# Patient Record
Sex: Male | Born: 1960 | Race: White | Hispanic: No | Marital: Single | Smoking: Current every day smoker
Health system: Southern US, Community
[De-identification: ages and names within clinical notes are randomized; demographics above are authoritative.]

## PROBLEM LIST (undated history)

## (undated) DIAGNOSIS — N200 Calculus of kidney: Secondary | ICD-10-CM

---

## 2012-06-17 ENCOUNTER — Emergency Department: Payer: Self-pay | Admitting: Emergency Medicine

## 2012-06-21 ENCOUNTER — Emergency Department: Payer: Self-pay | Admitting: Emergency Medicine

## 2013-04-14 ENCOUNTER — Emergency Department: Payer: Self-pay | Admitting: Emergency Medicine

## 2013-04-14 LAB — URINALYSIS, COMPLETE
Bacteria: NONE SEEN
Bilirubin,UR: NEGATIVE
GLUCOSE, UR: NEGATIVE mg/dL (ref 0–75)
Ketone: NEGATIVE
Leukocyte Esterase: NEGATIVE
Nitrite: NEGATIVE
Ph: 6 (ref 4.5–8.0)
Protein: NEGATIVE
SPECIFIC GRAVITY: 1.002 (ref 1.003–1.030)
Squamous Epithelial: NONE SEEN
WBC UR: 1 /HPF (ref 0–5)

## 2013-04-14 LAB — COMPREHENSIVE METABOLIC PANEL
ALK PHOS: 67 U/L
AST: 37 U/L (ref 15–37)
Albumin: 4.7 g/dL (ref 3.4–5.0)
Anion Gap: 2 — ABNORMAL LOW (ref 7–16)
BUN: 10 mg/dL (ref 7–18)
Bilirubin,Total: 0.6 mg/dL (ref 0.2–1.0)
Calcium, Total: 8.7 mg/dL (ref 8.5–10.1)
Chloride: 105 mmol/L (ref 98–107)
Co2: 32 mmol/L (ref 21–32)
Creatinine: 0.72 mg/dL (ref 0.60–1.30)
EGFR (African American): >60
GLUCOSE: 98 mg/dL (ref 65–99)
Osmolality: 277 (ref 275–301)
POTASSIUM: 3.7 mmol/L (ref 3.5–5.1)
SGPT (ALT): 34 U/L (ref 12–78)
Sodium: 139 mmol/L (ref 136–145)
Total Protein: 7.8 g/dL (ref 6.4–8.2)

## 2013-04-14 LAB — CBC WITH DIFFERENTIAL/PLATELET
BASOS PCT: 0.5 %
Basophil #: 0.1 10*3/uL (ref 0.0–0.1)
Eosinophil #: 0.3 10*3/uL (ref 0.0–0.7)
Eosinophil %: 2.2 %
HCT: 42.3 % (ref 40.0–52.0)
HGB: 14.4 g/dL (ref 13.0–18.0)
LYMPHS ABS: 2.4 10*3/uL (ref 1.0–3.6)
Lymphocyte %: 17.3 %
MCH: 31.5 pg (ref 26.0–34.0)
MCHC: 34.1 g/dL (ref 32.0–36.0)
MCV: 92 fL (ref 80–100)
Monocyte #: 1.2 x10 3/mm — ABNORMAL HIGH (ref 0.2–1.0)
Monocyte %: 8.4 %
NEUTROS ABS: 10.1 10*3/uL — AB (ref 1.4–6.5)
NEUTROS PCT: 71.6 %
Platelet: 304 10*3/uL (ref 150–440)
RBC: 4.58 10*6/uL (ref 4.40–5.90)
RDW: 13.1 % (ref 11.5–14.5)
WBC: 14.1 10*3/uL — ABNORMAL HIGH (ref 3.8–10.6)

## 2013-04-14 LAB — LIPASE, BLOOD: Lipase: 76 U/L (ref 73–393)

## 2013-12-26 ENCOUNTER — Emergency Department: Payer: Self-pay | Admitting: Emergency Medicine

## 2013-12-26 LAB — BASIC METABOLIC PANEL
Anion Gap: 3 — ABNORMAL LOW (ref 7–16)
BUN: 9 mg/dL (ref 7–18)
CO2: 29 mmol/L (ref 21–32)
Calcium, Total: 9.1 mg/dL (ref 8.5–10.1)
Chloride: 105 mmol/L (ref 98–107)
Creatinine: 0.82 mg/dL (ref 0.60–1.30)
EGFR (Non-African Amer.): >60
Glucose: 100 mg/dL — ABNORMAL HIGH (ref 65–99)
Osmolality: 273 (ref 275–301)
Potassium: 4.1 mmol/L (ref 3.5–5.1)
Sodium: 137 mmol/L (ref 136–145)

## 2013-12-26 LAB — URINALYSIS, COMPLETE
Bilirubin,UR: NEGATIVE
Glucose,UR: NEGATIVE mg/dL (ref 0–75)
KETONE: NEGATIVE
LEUKOCYTE ESTERASE: NEGATIVE
Nitrite: NEGATIVE
Ph: 6 (ref 4.5–8.0)
SQUAMOUS EPITHELIAL: NONE SEEN
Specific Gravity: 1.017 (ref 1.003–1.030)
WBC UR: 47 /HPF (ref 0–5)

## 2013-12-26 LAB — CBC
HCT: 44.1 % (ref 40.0–52.0)
HGB: 14.8 g/dL (ref 13.0–18.0)
MCH: 30.9 pg (ref 26.0–34.0)
MCHC: 33.5 g/dL (ref 32.0–36.0)
MCV: 92 fL (ref 80–100)
Platelet: 330 10*3/uL (ref 150–440)
RBC: 4.78 10*6/uL (ref 4.40–5.90)
RDW: 13.4 % (ref 11.5–14.5)
WBC: 14.2 10*3/uL — AB (ref 3.8–10.6)

## 2016-06-17 ENCOUNTER — Encounter: Payer: Self-pay | Admitting: *Deleted

## 2016-06-17 ENCOUNTER — Emergency Department
Admission: EM | Admit: 2016-06-17 | Discharge: 2016-06-17 | Disposition: A | Discharge status: Home / Self Care | Payer: Self-pay | Attending: Emergency Medicine | Admitting: Emergency Medicine

## 2016-06-17 DIAGNOSIS — M5441 Lumbago with sciatica, right side: Secondary | ICD-10-CM | POA: Insufficient documentation

## 2016-06-17 DIAGNOSIS — Y9389 Activity, other specified: Secondary | ICD-10-CM | POA: Insufficient documentation

## 2016-06-17 DIAGNOSIS — Y92007 Garden or yard of unspecified non-institutional (private) residence as the place of occurrence of the external cause: Secondary | ICD-10-CM | POA: Insufficient documentation

## 2016-06-17 DIAGNOSIS — S39012A Strain of muscle, fascia and tendon of lower back, initial encounter: Secondary | ICD-10-CM | POA: Insufficient documentation

## 2016-06-17 DIAGNOSIS — X500XXA Overexertion from strenuous movement or load, initial encounter: Secondary | ICD-10-CM | POA: Insufficient documentation

## 2016-06-17 DIAGNOSIS — F17210 Nicotine dependence, cigarettes, uncomplicated: Secondary | ICD-10-CM | POA: Insufficient documentation

## 2016-06-17 DIAGNOSIS — Y999 Unspecified external cause status: Secondary | ICD-10-CM | POA: Insufficient documentation

## 2016-06-17 DIAGNOSIS — M5431 Sciatica, right side: Secondary | ICD-10-CM

## 2016-06-17 HISTORY — DX: Calculus of kidney: N20.0

## 2016-06-17 MED ORDER — PREDNISONE 10 MG (21) PO TBPK: ORAL_TABLET | ORAL | 0 refills | Status: AC

## 2016-06-17 MED ORDER — CYCLOBENZAPRINE HCL 5 MG PO TABS: 5.0000 mg | ORAL_TABLET | Freq: Three times a day (TID) | ORAL | 0 refills | Status: AC | PRN

## 2016-06-17 MED ORDER — KETOROLAC TROMETHAMINE 60 MG/2ML IM SOLN
30.0000 mg | Freq: Once | INTRAMUSCULAR | Status: AC
  Administered 2016-06-17: 30 mg via INTRAMUSCULAR

## 2016-06-17 MED ORDER — KETOROLAC TROMETHAMINE 30 MG/ML IJ SOLN
30.0000 mg | Freq: Once | INTRAMUSCULAR | Status: DC
  Filled 2016-06-17: qty 1
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
## 2016-06-17 NOTE — ED Triage Notes (Signed)
PT reports feeling "something slip" in his back while doing yard work this week. Pt reports having chronic  Left leg pain but reports left sided back pain and left leg pain have increased since this weekend. Pt able to ambulate but reports a squeezing pain when putting weight on left leg.

## 2016-06-17 NOTE — ED Notes (Signed)
See triage note  States he developed pain to left lower back which moving into left leg last week  Unable to bear wt ambulates with limp

## 2019-12-07 ENCOUNTER — Emergency Department: Payer: Self-pay

## 2019-12-07 ENCOUNTER — Emergency Department
Admission: EM | Admit: 2019-12-07 | Discharge: 2019-12-07 | Disposition: A | Discharge status: Home / Self Care | Payer: Self-pay | Attending: Emergency Medicine | Admitting: Emergency Medicine

## 2019-12-07 ENCOUNTER — Encounter: Payer: Self-pay | Admitting: Emergency Medicine

## 2019-12-07 ENCOUNTER — Other Ambulatory Visit: Payer: Self-pay

## 2019-12-07 DIAGNOSIS — R7401 Elevation of levels of liver transaminase levels: Secondary | ICD-10-CM

## 2019-12-07 DIAGNOSIS — F17210 Nicotine dependence, cigarettes, uncomplicated: Secondary | ICD-10-CM | POA: Insufficient documentation

## 2019-12-07 DIAGNOSIS — N200 Calculus of kidney: Secondary | ICD-10-CM

## 2019-12-07 DIAGNOSIS — E860 Dehydration: Secondary | ICD-10-CM | POA: Insufficient documentation

## 2019-12-07 DIAGNOSIS — K838 Other specified diseases of biliary tract: Secondary | ICD-10-CM

## 2019-12-07 DIAGNOSIS — E876 Hypokalemia: Secondary | ICD-10-CM | POA: Insufficient documentation

## 2019-12-07 DIAGNOSIS — J189 Pneumonia, unspecified organism: Secondary | ICD-10-CM | POA: Insufficient documentation

## 2019-12-07 DIAGNOSIS — R509 Fever, unspecified: Secondary | ICD-10-CM

## 2019-12-07 DIAGNOSIS — Z20822 Contact with and (suspected) exposure to covid-19: Secondary | ICD-10-CM | POA: Insufficient documentation

## 2019-12-07 DIAGNOSIS — E8809 Other disorders of plasma-protein metabolism, not elsewhere classified: Secondary | ICD-10-CM | POA: Insufficient documentation

## 2019-12-07 LAB — CBC WITH DIFFERENTIAL/PLATELET
Abs Immature Granulocytes: 0.22 10*3/uL — ABNORMAL HIGH (ref 0.00–0.07)
Basophils Absolute: 0.1 10*3/uL (ref 0.0–0.1)
Basophils Relative: 0 %
Eosinophils Absolute: 0.1 10*3/uL (ref 0.0–0.5)
Eosinophils Relative: 1 %
HCT: 41.4 % (ref 39.0–52.0)
Hemoglobin: 14.8 g/dL (ref 13.0–17.0)
Immature Granulocytes: 2 %
Lymphocytes Relative: 9 %
Lymphs Abs: 1.4 10*3/uL (ref 0.7–4.0)
MCH: 30.6 pg (ref 26.0–34.0)
MCHC: 35.7 g/dL (ref 30.0–36.0)
MCV: 85.7 fL (ref 80.0–100.0)
Monocytes Absolute: 1.1 10*3/uL — ABNORMAL HIGH (ref 0.1–1.0)
Monocytes Relative: 7 %
Neutro Abs: 11.7 10*3/uL — ABNORMAL HIGH (ref 1.7–7.7)
Neutrophils Relative %: 81 %
Platelets: 285 10*3/uL (ref 150–400)
RBC: 4.83 MIL/uL (ref 4.22–5.81)
RDW: 14.1 % (ref 11.5–15.5)
WBC: 14.5 10*3/uL — ABNORMAL HIGH (ref 4.0–10.5)
nRBC: 0 % (ref 0.0–0.2)

## 2019-12-07 LAB — URINALYSIS, COMPLETE (UACMP) WITH MICROSCOPIC
Bacteria, UA: NONE SEEN
Bilirubin Urine: NEGATIVE
Glucose, UA: 50 mg/dL — AB
Ketones, ur: NEGATIVE mg/dL
Leukocytes,Ua: NEGATIVE
Nitrite: NEGATIVE
Protein, ur: NEGATIVE mg/dL
Specific Gravity, Urine: 1.021 (ref 1.005–1.030)
pH: 6 (ref 5.0–8.0)

## 2019-12-07 LAB — CBC
HCT: 41.4 % (ref 39.0–52.0)
Hemoglobin: 14.7 g/dL (ref 13.0–17.0)
MCH: 30.4 pg (ref 26.0–34.0)
MCHC: 35.5 g/dL (ref 30.0–36.0)
MCV: 85.5 fL (ref 80.0–100.0)
Platelets: 282 10*3/uL (ref 150–400)
RBC: 4.84 MIL/uL (ref 4.22–5.81)
RDW: 13.9 % (ref 11.5–15.5)
WBC: 13.8 10*3/uL — ABNORMAL HIGH (ref 4.0–10.5)
nRBC: 0 % (ref 0.0–0.2)

## 2019-12-07 LAB — COMPREHENSIVE METABOLIC PANEL
ALT: 46 U/L — ABNORMAL HIGH (ref 0–44)
AST: 74 U/L — ABNORMAL HIGH (ref 15–41)
Albumin: 2.6 g/dL — ABNORMAL LOW (ref 3.5–5.0)
Alkaline Phosphatase: 131 U/L — ABNORMAL HIGH (ref 38–126)
Anion gap: 10 (ref 5–15)
BUN: 13 mg/dL (ref 8–23)
CO2: 28 mmol/L (ref 22–32)
Calcium: 7.9 mg/dL — ABNORMAL LOW (ref 8.9–10.3)
Chloride: 95 mmol/L — ABNORMAL LOW (ref 98–111)
Creatinine, Ser: 0.75 mg/dL (ref 0.61–1.24)
GFR, Estimated: >60 mL/min (ref >60)
Glucose, Bld: 106 mg/dL — ABNORMAL HIGH (ref 70–99)
Potassium: 3 mmol/L — ABNORMAL LOW (ref 3.5–5.1)
Sodium: 133 mmol/L — ABNORMAL LOW (ref 135–145)
Total Bilirubin: 0.7 mg/dL (ref 0.3–1.2)
Total Protein: 6.2 g/dL — ABNORMAL LOW (ref 6.5–8.1)

## 2019-12-07 LAB — PROCALCITONIN: Procalcitonin: 0.83 ng/mL

## 2019-12-07 LAB — RESPIRATORY PANEL BY RT PCR (FLU A&B, COVID)
Influenza A by PCR: NEGATIVE
Influenza B by PCR: NEGATIVE
SARS Coronavirus 2 by RT PCR: NEGATIVE

## 2019-12-07 LAB — LIPASE, BLOOD: Lipase: 15 U/L (ref 11–51)

## 2019-12-07 MED ORDER — SODIUM CHLORIDE 0.9 % IV SOLN
1.0000 g | Freq: Once | INTRAVENOUS | Status: AC
  Administered 2019-12-07: 1 g via INTRAVENOUS
  Filled 2019-12-07: qty 10

## 2019-12-07 MED ORDER — LACTATED RINGERS IV BOLUS
1000.0000 mL | Freq: Once | INTRAVENOUS | Status: AC
  Administered 2019-12-07: 1000 mL via INTRAVENOUS

## 2019-12-07 MED ORDER — DOXYCYCLINE HYCLATE 100 MG PO CAPS
100.0000 mg | ORAL_CAPSULE | Freq: Two times a day (BID) | ORAL | 0 refills | Status: DC
Start: 2019-12-07 — End: 2019-12-07

## 2019-12-07 MED ORDER — POTASSIUM CHLORIDE CRYS ER 20 MEQ PO TBCR
40.0000 meq | EXTENDED_RELEASE_TABLET | Freq: Once | ORAL | Status: AC
  Administered 2019-12-07: 40 meq via ORAL
  Filled 2019-12-07: qty 2

## 2019-12-07 MED ORDER — IOHEXOL 300 MG/ML  SOLN
100.0000 mL | Freq: Once | INTRAMUSCULAR | Status: AC | PRN
  Administered 2019-12-07: 100 mL via INTRAVENOUS
  Filled 2019-12-07: qty 100

## 2019-12-07 MED ORDER — DOXYCYCLINE HYCLATE 100 MG PO CAPS
100.0000 mg | ORAL_CAPSULE | Freq: Two times a day (BID) | ORAL | 0 refills | Status: AC
Start: 2019-12-07 — End: 2019-12-17

## 2019-12-07 MED ORDER — SODIUM CHLORIDE 0.9 % IV SOLN
500.0000 mg | Freq: Once | INTRAVENOUS | Status: AC
  Administered 2019-12-07: 500 mg via INTRAVENOUS
  Filled 2019-12-07: qty 500

## 2019-12-07 NOTE — ED Triage Notes (Signed)
Pt comes into the ED via POV c/o emesis, nausea, and fevers at home.  Pt thought it was food poisoning but it has persisted for the past 8 days. Pt has even and unlabored respirations at this time. Pt denies any abdominal pain at this time.
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
## 2019-12-07 NOTE — Discharge Instructions (Addendum)
Your common bile ducts and liver enzymes are abnormal today.  I discussed this with her gastroenterologist who would like to see you in the clinic tomorrow or the day after.  Your CT today showed: 1. Bilateral ground-glass airspace opacities with extensive consolidation throughout the left upper lobe with a crazy paving pattern. Findings are concerning for multifocal interstitial pneumonia (viral or bacterial). 2. New extensive intrahepatic and extrahepatic biliary ductal dilatation. The pancreatic duct is dilated. Correlation with laboratory studies is recommended. Follow-up MRCP is recommended for further evaluation. 3. There is cholelithiasis without secondary signs of acute cholecystitis. 4. Extensive calcifications throughout the patient's pancreas consistent with chronic pancreatitis. 5. Nonobstructive left nephrolithiasis. 6. Enlarged prostate gland.   Aortic Atherosclerosis (ICD10-I70.0) and Emphysema (ICD10-J43.9).

## 2019-12-07 NOTE — ED Notes (Signed)
Pt to xray

## 2019-12-10 LAB — LEGIONELLA PNEUMOPHILA SEROGP 1 UR AG: L. pneumophila Serogp 1 Ur Ag: NEGATIVE

## 2019-12-14 ENCOUNTER — Telehealth: Payer: Self-pay | Admitting: Gastroenterology

## 2019-12-14 NOTE — Telephone Encounter (Signed)
Tried calling pt 2x to schedule Hosp follow up per Dr. Maximino Greenland from 10.27.21. No answer or voicemail. FYI

## 2022-01-29 IMAGING — CR DG CHEST 2V
1 series · 2 of 2 positions shown · non-contrast
Comparison: No pertinent prior exams are available for comparison.

CLINICAL DATA: Fevers. Additional history provided: Emesis, nausea,
fevers.

EXAM:
CHEST - 2 VIEW

[Series 1: dg chest 2 view · 0.14mm/px · 2 of 2 slices shown]
[im 1/2]
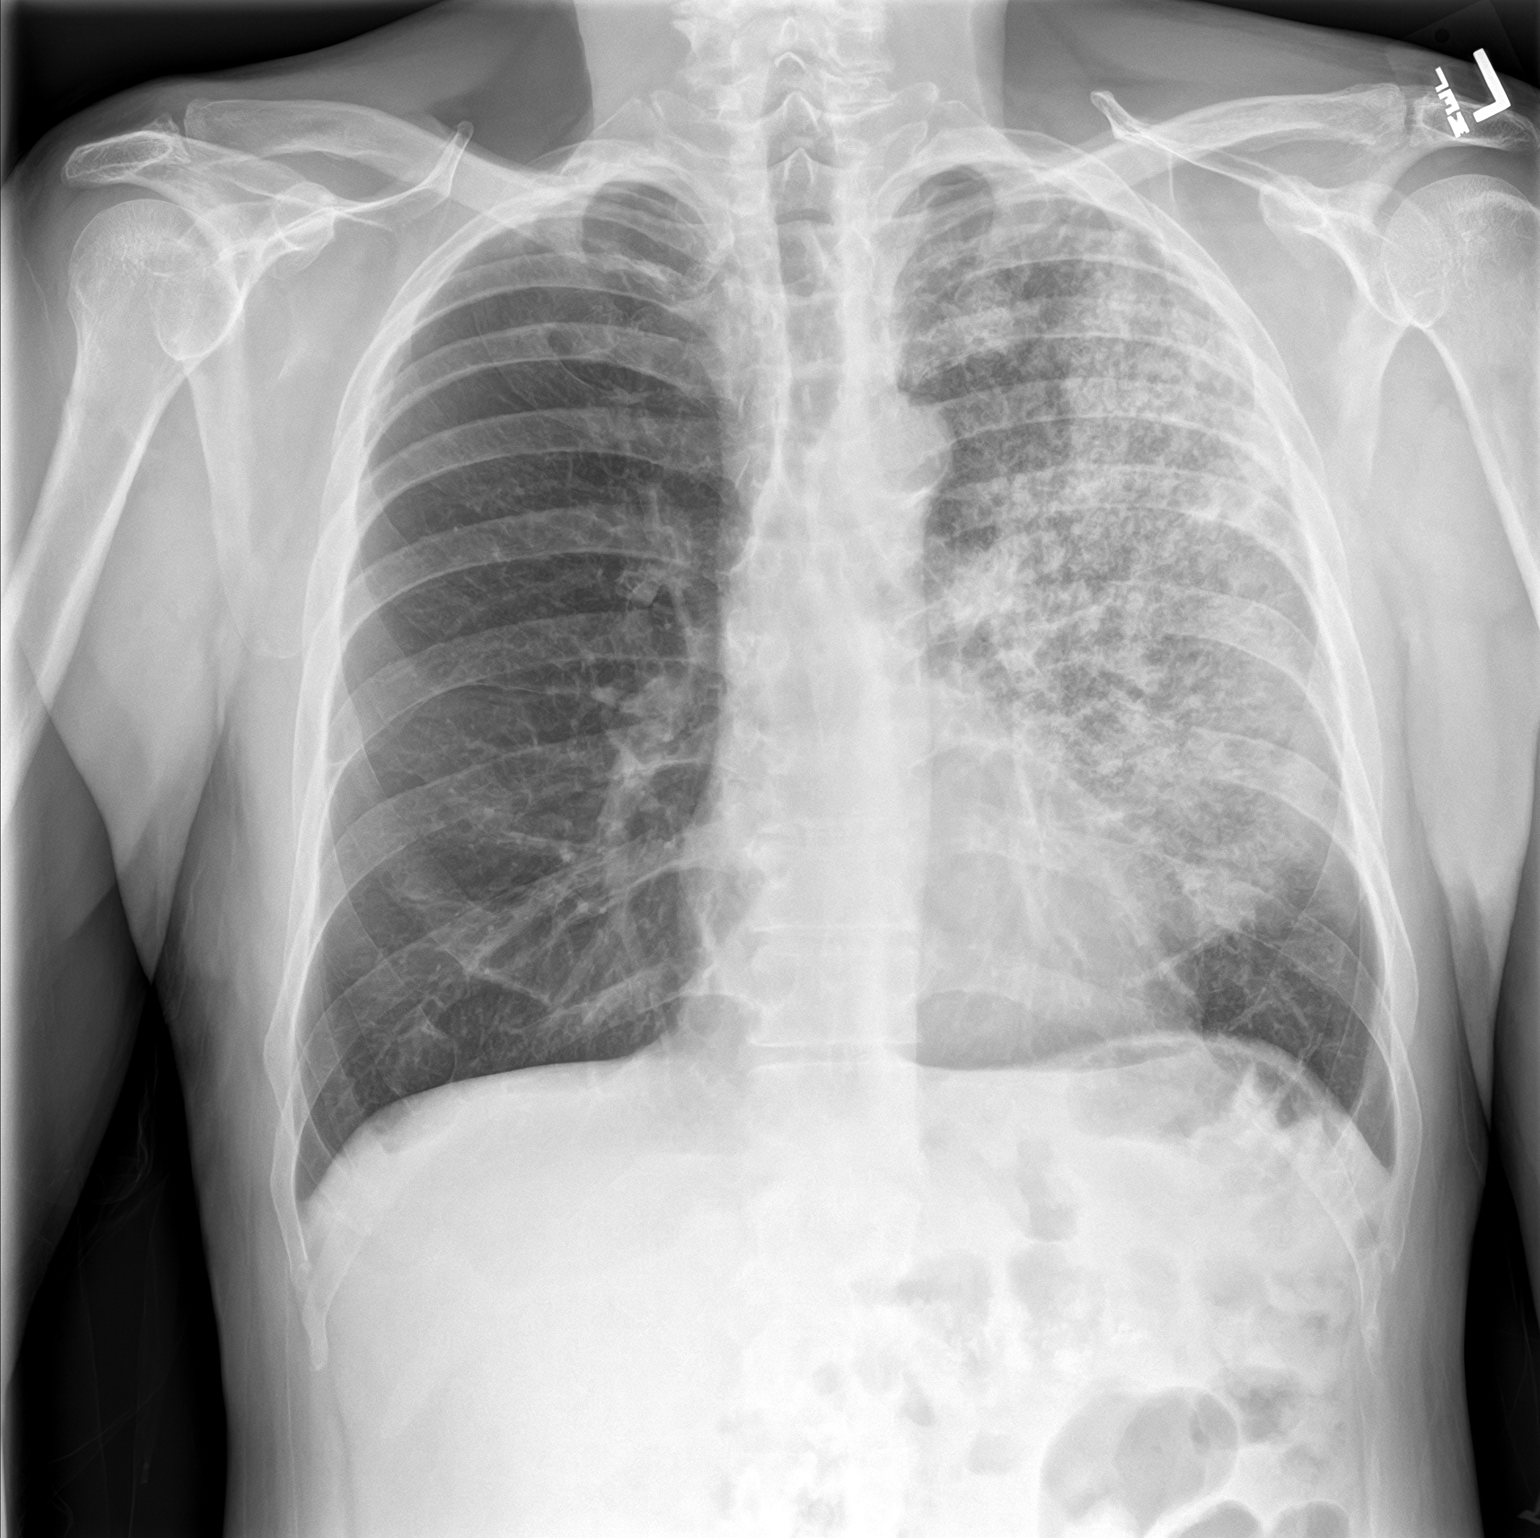
[im 2/2]
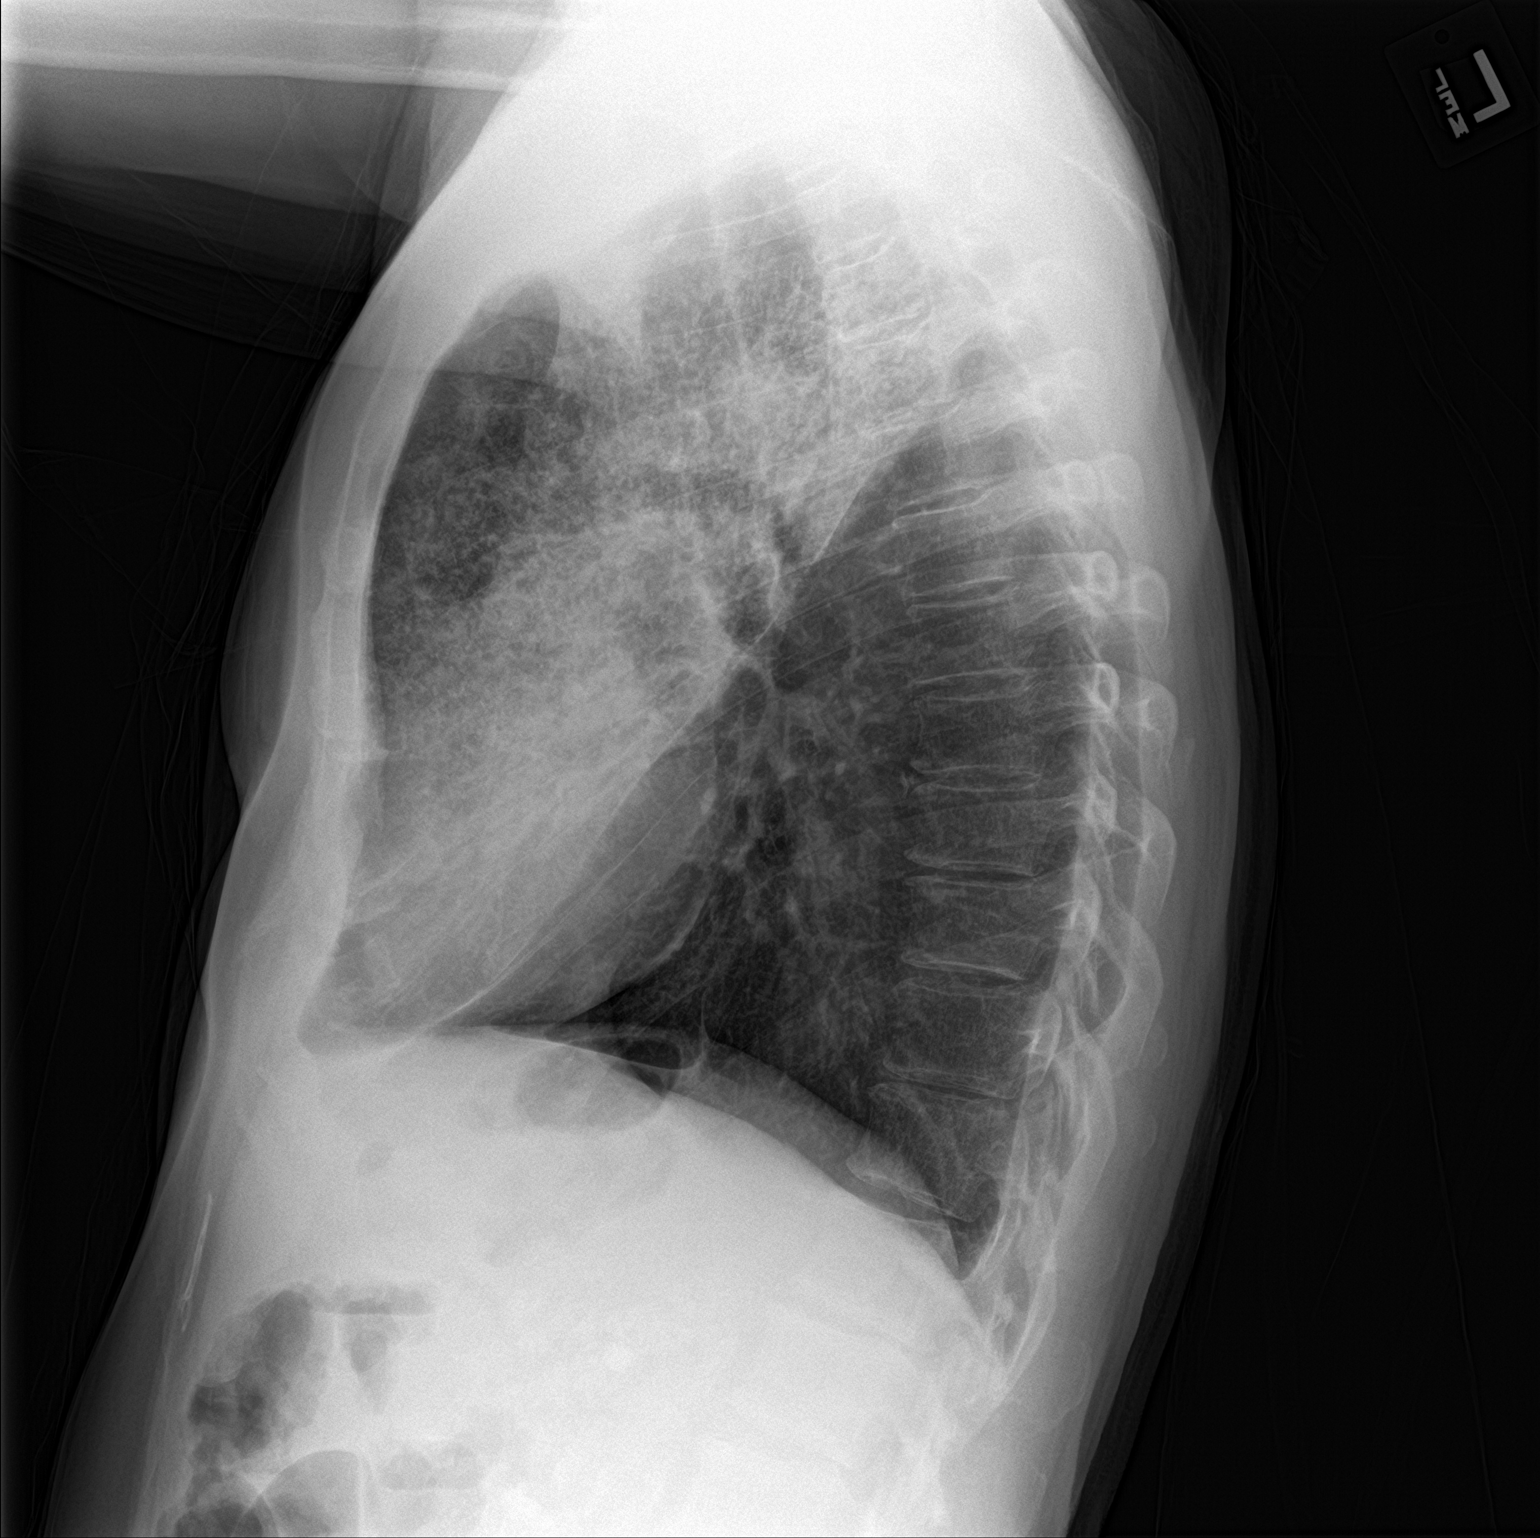

[2 of 2 positions shown; findings below may reference images not displayed]

FINDINGS: Heart size within normal limits. Aortic atherosclerosis. There is
extensive airspace disease throughout the left upper lobe and
lingula. The right lung is clear. No evidence of pleural effusion or
pneumothorax. No acute bony abnormality identified.
IMPRESSION: Extensive airspace disease throughout the left upper lobe and
lingula likely reflecting pneumonia. Followup PA and lateral chest
X-ray is recommended in 3-4 weeks following trial of antibiotic
therapy to ensure resolution and exclude alternative etiologies.

Aortic Atherosclerosis ([id number]).
